# Patient Record
Sex: Male | Born: 1940 | Race: White | Hispanic: No | Marital: Married | Smoking: Never smoker
Health system: Southern US, Community
[De-identification: ages and names within clinical notes are randomized; demographics above are authoritative.]

## PROBLEM LIST (undated history)

## (undated) DIAGNOSIS — I10 Essential (primary) hypertension: Secondary | ICD-10-CM

## (undated) DIAGNOSIS — M1990 Unspecified osteoarthritis, unspecified site: Secondary | ICD-10-CM

## (undated) DIAGNOSIS — M5430 Sciatica, unspecified side: Secondary | ICD-10-CM

## (undated) DIAGNOSIS — Z87442 Personal history of urinary calculi: Secondary | ICD-10-CM

## (undated) DIAGNOSIS — C801 Malignant (primary) neoplasm, unspecified: Secondary | ICD-10-CM

## (undated) HISTORY — PX: PROSTATE SURGERY: SHX751

## (undated) HISTORY — PX: TONSILLECTOMY: SUR1361

## (undated) HISTORY — PX: APPENDECTOMY: SHX54

---

## 2012-06-30 DIAGNOSIS — C801 Malignant (primary) neoplasm, unspecified: Secondary | ICD-10-CM

## 2012-06-30 HISTORY — DX: Malignant (primary) neoplasm, unspecified: C80.1

## 2015-06-27 ENCOUNTER — Other Ambulatory Visit (HOSPITAL_COMMUNITY): Payer: Self-pay | Admitting: Orthopaedic Surgery

## 2015-07-03 ENCOUNTER — Encounter (HOSPITAL_COMMUNITY): Payer: Self-pay

## 2015-07-03 ENCOUNTER — Encounter (HOSPITAL_COMMUNITY)
Admission: RE | Admit: 2015-07-03 | Discharge: 2015-07-03 | Disposition: A | Discharge status: Home / Self Care | Payer: Medicare Other | Source: Ambulatory Visit | Attending: Orthopaedic Surgery | Admitting: Orthopaedic Surgery

## 2015-07-03 DIAGNOSIS — I10 Essential (primary) hypertension: Secondary | ICD-10-CM | POA: Diagnosis not present

## 2015-07-03 DIAGNOSIS — Z01812 Encounter for preprocedural laboratory examination: Secondary | ICD-10-CM | POA: Insufficient documentation

## 2015-07-03 DIAGNOSIS — M1611 Unilateral primary osteoarthritis, right hip: Secondary | ICD-10-CM | POA: Diagnosis not present

## 2015-07-03 DIAGNOSIS — Z79899 Other long term (current) drug therapy: Secondary | ICD-10-CM | POA: Diagnosis not present

## 2015-07-03 HISTORY — DX: Unspecified osteoarthritis, unspecified site: M19.90

## 2015-07-03 HISTORY — DX: Malignant (primary) neoplasm, unspecified: C80.1

## 2015-07-03 HISTORY — DX: Essential (primary) hypertension: I10

## 2015-07-03 HISTORY — DX: Sciatica, unspecified side: M54.30

## 2015-07-03 HISTORY — DX: Personal history of urinary calculi: Z87.442

## 2015-07-03 LAB — BASIC METABOLIC PANEL
ANION GAP: 6 (ref 5–15)
BUN: 15 mg/dL (ref 6–20)
CHLORIDE: 109 mmol/L (ref 101–111)
CO2: 24 mmol/L (ref 22–32)
Calcium: 9.1 mg/dL (ref 8.9–10.3)
Creatinine, Ser: 0.95 mg/dL (ref 0.61–1.24)
GFR calc Af Amer: >60 mL/min (ref >60)
GLUCOSE: 103 mg/dL — AB (ref 65–99)
POTASSIUM: 4.3 mmol/L (ref 3.5–5.1)
Sodium: 139 mmol/L (ref 135–145)

## 2015-07-03 LAB — SURGICAL PCR SCREEN
MRSA, PCR: NEGATIVE
Staphylococcus aureus: NEGATIVE

## 2015-07-03 LAB — CBC
HEMATOCRIT: 44.1 % (ref 39.0–52.0)
HEMOGLOBIN: 14.7 g/dL (ref 13.0–17.0)
MCH: 30.3 pg (ref 26.0–34.0)
MCHC: 33.3 g/dL (ref 30.0–36.0)
MCV: 90.9 fL (ref 78.0–100.0)
Platelets: 275 10*3/uL (ref 150–400)
RBC: 4.85 MIL/uL (ref 4.22–5.81)
RDW: 14.6 % (ref 11.5–15.5)
WBC: 6.7 10*3/uL (ref 4.0–10.5)

## 2015-07-03 NOTE — Pre-Procedure Instructions (Signed)
    Scott Rowland  07/03/2015     Your procedure is scheduled on Tuesday, January 10.  Report to [facility] Admitting at 1:25 P.M.               Your surgery or procedure is scheduled for 3:25 PM   Call this number if you have problems the morning of surgery:[phone]               For any other questions, please call [phone], Monday - Friday 8 AM - 4 PM.   Remember:  Do not eat food or drink liquids after midnight Monday, January 9.  Take these medicines the morning of surgery with A SIP OF WATER: rosuvastatin (CRESTOR) if due.               Today, January 3, STOP taking Stop taking Aspirin, Aspirin Products and herbal medications.  Do not take any NSAIDS ie:  Ibuprofen, Advil, Naproxen.   Do not wear jewelry, make-up or nail polish.  Do not wear lotions, powders, or perfumes.    Men may shave face and neck.  Do not bring valuables to the hospital.  [facility] is not responsible for any belongings or valuables.  Contacts, dentures or bridgework may not be worn into surgery.  Leave your suitcase in the car.  After surgery it may be brought to your room.  For patients admitted to the hospital, discharge time will be determined by your treatment team.  Patients discharged the day of surgery will not be allowed to drive home.   Name and phone number of your driver:  -  Special instructions: Review  [facility] - Preparing For Surgery.  Please read over the following fact sheets that you were given. Pain Booklet, Coughing and Deep Breathing and Surgical Site Infection Prevention and Incentive Spirometery

## 2015-07-03 NOTE — Progress Notes (Signed)
PCP is Dr Jess Barters in [place].  Patient resides 6 months in [place] and 6 months in [place] , [place].  Patient denies chest pain, does not see a cardiologist. Had an EKG within the year at [facility] in [place].  I requested EKG.

## 2015-07-09 MED ORDER — SODIUM CHLORIDE 0.9 % IV SOLN
1000.0000 mg | INTRAVENOUS | Status: DC
Start: 2015-07-09 — End: 2015-07-10
  Filled 2015-07-09: qty 10

## 2015-07-09 MED ORDER — CEFAZOLIN SODIUM-DEXTROSE 2-3 GM-% IV SOLR
2.0000 g | INTRAVENOUS | Status: AC
  Administered 2015-07-10: 2 g via INTRAVENOUS
  Filled 2015-07-09: qty 50

## 2015-07-10 ENCOUNTER — Encounter (HOSPITAL_COMMUNITY)
Admission: RE | Disposition: A | Discharge status: Home Health | Payer: Self-pay | Source: Ambulatory Visit | Attending: Orthopaedic Surgery

## 2015-07-10 ENCOUNTER — Inpatient Hospital Stay (HOSPITAL_COMMUNITY): Payer: Medicare Other | Admitting: Anesthesiology

## 2015-07-10 ENCOUNTER — Encounter (HOSPITAL_COMMUNITY): Payer: Self-pay | Admitting: General Practice

## 2015-07-10 ENCOUNTER — Inpatient Hospital Stay (HOSPITAL_COMMUNITY): Payer: Medicare Other

## 2015-07-10 ENCOUNTER — Inpatient Hospital Stay (HOSPITAL_COMMUNITY)
Admission: RE | Admit: 2015-07-10 | Discharge: 2015-07-12 | DRG: 470 | Disposition: A | Discharge status: Home Health | Payer: Medicare Other | Source: Ambulatory Visit | Attending: Orthopaedic Surgery | Admitting: Orthopaedic Surgery

## 2015-07-10 DIAGNOSIS — Z419 Encounter for procedure for purposes other than remedying health state, unspecified: Secondary | ICD-10-CM

## 2015-07-10 DIAGNOSIS — Z8546 Personal history of malignant neoplasm of prostate: Secondary | ICD-10-CM | POA: Diagnosis not present

## 2015-07-10 DIAGNOSIS — I10 Essential (primary) hypertension: Secondary | ICD-10-CM | POA: Diagnosis present

## 2015-07-10 DIAGNOSIS — M1611 Unilateral primary osteoarthritis, right hip: Principal | ICD-10-CM | POA: Diagnosis present

## 2015-07-10 DIAGNOSIS — Z96641 Presence of right artificial hip joint: Secondary | ICD-10-CM

## 2015-07-10 DIAGNOSIS — M25551 Pain in right hip: Secondary | ICD-10-CM | POA: Diagnosis present

## 2015-07-10 HISTORY — PX: TOTAL HIP ARTHROPLASTY: SHX124

## 2015-07-10 SURGERY — ARTHROPLASTY, HIP, TOTAL, ANTERIOR APPROACH
Anesthesia: General | Laterality: Right

## 2015-07-10 MED ORDER — FENTANYL CITRATE (PF) 100 MCG/2ML IJ SOLN
INTRAMUSCULAR | Status: AC
  Administered 2015-07-10: 50 ug via INTRAVENOUS
  Filled 2015-07-10: qty 2

## 2015-07-10 MED ORDER — SODIUM CHLORIDE 0.9 % IV SOLN
INTRAVENOUS | Status: DC
Start: 2015-07-10 — End: 2015-07-12
  Administered 2015-07-10: 21:00:00 via INTRAVENOUS

## 2015-07-10 MED ORDER — PROMETHAZINE HCL 25 MG/ML IJ SOLN: 6.2500 mg | INTRAMUSCULAR | Status: DC | PRN

## 2015-07-10 MED ORDER — SODIUM CHLORIDE 0.9 % IR SOLN
Status: DC | PRN
  Administered 2015-07-10: 1000 mL

## 2015-07-10 MED ORDER — PHENYLEPHRINE 40 MCG/ML (10ML) SYRINGE FOR IV PUSH (FOR BLOOD PRESSURE SUPPORT)
PREFILLED_SYRINGE | INTRAVENOUS | Status: AC
  Filled 2015-07-10: qty 10

## 2015-07-10 MED ORDER — POLYETHYLENE GLYCOL 3350 17 G PO PACK: 17.0000 g | PACK | Freq: Every day | ORAL | Status: DC | PRN

## 2015-07-10 MED ORDER — ONDANSETRON HCL 4 MG/2ML IJ SOLN
INTRAMUSCULAR | Status: DC | PRN
  Administered 2015-07-10: 4 mg via INTRAVENOUS

## 2015-07-10 MED ORDER — DOCUSATE SODIUM 100 MG PO CAPS
100.0000 mg | ORAL_CAPSULE | Freq: Two times a day (BID) | ORAL | Status: DC
  Administered 2015-07-10 – 2015-07-12 (×4): 100 mg via ORAL
  Filled 2015-07-10 (×4): qty 1

## 2015-07-10 MED ORDER — KETOROLAC TROMETHAMINE 15 MG/ML IJ SOLN
7.5000 mg | Freq: Four times a day (QID) | INTRAMUSCULAR | Status: AC
  Administered 2015-07-10 – 2015-07-11 (×4): 7.5 mg via INTRAVENOUS
  Filled 2015-07-10 (×3): qty 1

## 2015-07-10 MED ORDER — MIDAZOLAM HCL 2 MG/2ML IJ SOLN
INTRAMUSCULAR | Status: AC
  Filled 2015-07-10: qty 2

## 2015-07-10 MED ORDER — 0.9 % SODIUM CHLORIDE (POUR BTL) OPTIME
TOPICAL | Status: DC | PRN
  Administered 2015-07-10: 1000 mL

## 2015-07-10 MED ORDER — FENTANYL CITRATE (PF) 250 MCG/5ML IJ SOLN
INTRAMUSCULAR | Status: AC
  Filled 2015-07-10: qty 5

## 2015-07-10 MED ORDER — METHOCARBAMOL 500 MG PO TABS
500.0000 mg | ORAL_TABLET | Freq: Four times a day (QID) | ORAL | Status: DC | PRN
  Administered 2015-07-10 – 2015-07-11 (×2): 500 mg via ORAL
  Filled 2015-07-10 (×2): qty 1

## 2015-07-10 MED ORDER — OXYCODONE HCL 5 MG PO TABS
ORAL_TABLET | ORAL | Status: AC
  Filled 2015-07-10: qty 2

## 2015-07-10 MED ORDER — OXYCODONE HCL 5 MG PO TABS
5.0000 mg | ORAL_TABLET | ORAL | Status: DC | PRN
  Administered 2015-07-10 – 2015-07-12 (×7): 10 mg via ORAL
  Filled 2015-07-10 (×7): qty 2

## 2015-07-10 MED ORDER — METHOCARBAMOL 500 MG PO TABS
ORAL_TABLET | ORAL | Status: AC
  Filled 2015-07-10: qty 1

## 2015-07-10 MED ORDER — ACETAMINOPHEN 325 MG PO TABS
650.0000 mg | ORAL_TABLET | Freq: Four times a day (QID) | ORAL | Status: DC | PRN
  Administered 2015-07-11: 650 mg via ORAL
  Filled 2015-07-10: qty 2

## 2015-07-10 MED ORDER — METOCLOPRAMIDE HCL 5 MG/ML IJ SOLN: 5.0000 mg | Freq: Three times a day (TID) | INTRAMUSCULAR | Status: DC | PRN

## 2015-07-10 MED ORDER — ONDANSETRON HCL 4 MG PO TABS
4.0000 mg | ORAL_TABLET | Freq: Four times a day (QID) | ORAL | Status: DC | PRN
Start: 2015-07-10 — End: 2015-07-12

## 2015-07-10 MED ORDER — ACETAMINOPHEN 650 MG RE SUPP: 650.0000 mg | Freq: Four times a day (QID) | RECTAL | Status: DC | PRN

## 2015-07-10 MED ORDER — DIPHENHYDRAMINE HCL 12.5 MG/5ML PO ELIX: 12.5000 mg | ORAL_SOLUTION | ORAL | Status: DC | PRN

## 2015-07-10 MED ORDER — LACTATED RINGERS IV SOLN
INTRAVENOUS | Status: DC
Start: 2015-07-10 — End: 2015-07-10
  Administered 2015-07-10 (×2): via INTRAVENOUS

## 2015-07-10 MED ORDER — FENTANYL CITRATE (PF) 250 MCG/5ML IJ SOLN
INTRAMUSCULAR | Status: DC | PRN
  Administered 2015-07-10: 50 ug via INTRAVENOUS
  Administered 2015-07-10: 100 ug via INTRAVENOUS
  Administered 2015-07-10 (×2): 50 ug via INTRAVENOUS

## 2015-07-10 MED ORDER — TRANEXAMIC ACID 1000 MG/10ML IV SOLN
1000.0000 mg | Freq: Once | INTRAVENOUS | Status: AC
  Administered 2015-07-10: 1000 mg via INTRAVENOUS
  Filled 2015-07-10: qty 10

## 2015-07-10 MED ORDER — LIDOCAINE HCL (CARDIAC) 20 MG/ML IV SOLN
INTRAVENOUS | Status: AC
  Filled 2015-07-10: qty 5

## 2015-07-10 MED ORDER — METHOCARBAMOL 1000 MG/10ML IJ SOLN: 500.0000 mg | Freq: Four times a day (QID) | INTRAVENOUS | Status: DC | PRN

## 2015-07-10 MED ORDER — EPHEDRINE SULFATE 50 MG/ML IJ SOLN
INTRAMUSCULAR | Status: DC | PRN
  Administered 2015-07-10: 10 mg via INTRAVENOUS

## 2015-07-10 MED ORDER — ALUM & MAG HYDROXIDE-SIMETH 200-200-20 MG/5ML PO SUSP: 30.0000 mL | ORAL | Status: DC | PRN

## 2015-07-10 MED ORDER — ASPIRIN EC 325 MG PO TBEC
325.0000 mg | DELAYED_RELEASE_TABLET | Freq: Two times a day (BID) | ORAL | Status: DC
  Administered 2015-07-11 – 2015-07-12 (×3): 325 mg via ORAL
  Filled 2015-07-10 (×3): qty 1

## 2015-07-10 MED ORDER — BISACODYL 10 MG RE SUPP: 10.0000 mg | Freq: Every day | RECTAL | Status: DC | PRN

## 2015-07-10 MED ORDER — PHENOL 1.4 % MT LIQD: 1.0000 | OROMUCOSAL | Status: DC | PRN

## 2015-07-10 MED ORDER — GLYCOPYRROLATE 0.2 MG/ML IJ SOLN
INTRAMUSCULAR | Status: DC | PRN
  Administered 2015-07-10: 0.6 mg via INTRAVENOUS

## 2015-07-10 MED ORDER — ONDANSETRON HCL 4 MG/2ML IJ SOLN: 4.0000 mg | Freq: Four times a day (QID) | INTRAMUSCULAR | Status: DC | PRN

## 2015-07-10 MED ORDER — ROCURONIUM BROMIDE 50 MG/5ML IV SOLN
INTRAVENOUS | Status: AC
  Filled 2015-07-10: qty 1

## 2015-07-10 MED ORDER — HYDROMORPHONE HCL 1 MG/ML IJ SOLN
INTRAMUSCULAR | Status: AC
  Filled 2015-07-10: qty 1

## 2015-07-10 MED ORDER — ROCURONIUM BROMIDE 100 MG/10ML IV SOLN
INTRAVENOUS | Status: DC | PRN
  Administered 2015-07-10: 40 mg via INTRAVENOUS

## 2015-07-10 MED ORDER — MEPERIDINE HCL 25 MG/ML IJ SOLN: 6.2500 mg | INTRAMUSCULAR | Status: DC | PRN

## 2015-07-10 MED ORDER — CEFAZOLIN SODIUM 1-5 GM-% IV SOLN
1.0000 g | Freq: Four times a day (QID) | INTRAVENOUS | Status: AC
  Administered 2015-07-10 – 2015-07-11 (×2): 1 g via INTRAVENOUS
  Filled 2015-07-10 (×2): qty 50

## 2015-07-10 MED ORDER — ADULT MULTIVITAMIN W/MINERALS CH
1.0000 | ORAL_TABLET | Freq: Every day | ORAL | Status: DC
  Administered 2015-07-11 – 2015-07-12 (×2): 1 via ORAL
  Filled 2015-07-10 (×2): qty 1

## 2015-07-10 MED ORDER — KETOROLAC TROMETHAMINE 15 MG/ML IJ SOLN
INTRAMUSCULAR | Status: AC
  Administered 2015-07-10: 7.5 mg via INTRAVENOUS
  Filled 2015-07-10: qty 1

## 2015-07-10 MED ORDER — MIDAZOLAM HCL 5 MG/5ML IJ SOLN
INTRAMUSCULAR | Status: DC | PRN
  Administered 2015-07-10: 2 mg via INTRAVENOUS

## 2015-07-10 MED ORDER — METOCLOPRAMIDE HCL 5 MG PO TABS: 5.0000 mg | ORAL_TABLET | Freq: Three times a day (TID) | ORAL | Status: DC | PRN

## 2015-07-10 MED ORDER — LIDOCAINE HCL (CARDIAC) 20 MG/ML IV SOLN
INTRAVENOUS | Status: DC | PRN
  Administered 2015-07-10: 60 mg via INTRAVENOUS

## 2015-07-10 MED ORDER — HYDROMORPHONE HCL 1 MG/ML IJ SOLN
1.0000 mg | INTRAMUSCULAR | Status: DC | PRN
  Administered 2015-07-10: 1 mg via INTRAVENOUS

## 2015-07-10 MED ORDER — PHENYLEPHRINE HCL 10 MG/ML IJ SOLN
INTRAMUSCULAR | Status: DC | PRN
  Administered 2015-07-10: 40 ug via INTRAVENOUS
  Administered 2015-07-10: 120 ug via INTRAVENOUS
  Administered 2015-07-10 (×2): 80 ug via INTRAVENOUS
  Administered 2015-07-10: 40 ug via INTRAVENOUS
  Administered 2015-07-10: 80 ug via INTRAVENOUS

## 2015-07-10 MED ORDER — NEOSTIGMINE METHYLSULFATE 10 MG/10ML IV SOLN
INTRAVENOUS | Status: DC | PRN
  Administered 2015-07-10: 4 mg via INTRAVENOUS

## 2015-07-10 MED ORDER — PROPOFOL 10 MG/ML IV BOLUS
INTRAVENOUS | Status: AC
  Filled 2015-07-10: qty 20

## 2015-07-10 MED ORDER — FENTANYL CITRATE (PF) 100 MCG/2ML IJ SOLN
INTRAMUSCULAR | Status: AC
  Filled 2015-07-10: qty 2

## 2015-07-10 MED ORDER — PROPOFOL 10 MG/ML IV BOLUS
INTRAVENOUS | Status: DC | PRN
  Administered 2015-07-10: 180 mg via INTRAVENOUS

## 2015-07-10 MED ORDER — MENTHOL 3 MG MT LOZG: 1.0000 | LOZENGE | OROMUCOSAL | Status: DC | PRN

## 2015-07-10 MED ORDER — FENTANYL CITRATE (PF) 100 MCG/2ML IJ SOLN
25.0000 ug | INTRAMUSCULAR | Status: DC | PRN
  Administered 2015-07-10 (×3): 50 ug via INTRAVENOUS

## 2015-07-10 SURGICAL SUPPLY — 52 items
BENZOIN TINCTURE PRP APPL 2/3 (GAUZE/BANDAGES/DRESSINGS) ×3 IMPLANT
BLADE SAW SGTL 18X1.27X75 (BLADE) ×2 IMPLANT
BLADE SAW SGTL 18X1.27X75MM (BLADE) ×1
BLADE SURG ROTATE 9660 (MISCELLANEOUS) IMPLANT
CAPT HIP TOTAL 2 ×3 IMPLANT
CELLS DAT CNTRL 66122 CELL SVR (MISCELLANEOUS) ×1 IMPLANT
CLOSURE WOUND 1/2 X4 (GAUZE/BANDAGES/DRESSINGS) ×1
COVER SURGICAL LIGHT HANDLE (MISCELLANEOUS) ×3 IMPLANT
DRAPE C-ARM 42X72 X-RAY (DRAPES) ×3 IMPLANT
DRAPE STERI IOBAN 125X83 (DRAPES) ×3 IMPLANT
DRAPE U-SHAPE 47X51 STRL (DRAPES) ×9 IMPLANT
DRSG AQUACEL AG ADV 3.5X10 (GAUZE/BANDAGES/DRESSINGS) ×3 IMPLANT
DURAPREP 26ML APPLICATOR (WOUND CARE) ×3 IMPLANT
ELECT BLADE 4.0 EZ CLEAN MEGAD (MISCELLANEOUS) ×3
ELECT BLADE 6.5 EXT (BLADE) IMPLANT
ELECT REM PT RETURN 9FT ADLT (ELECTROSURGICAL) ×3
ELECTRODE BLDE 4.0 EZ CLN MEGD (MISCELLANEOUS) ×1 IMPLANT
ELECTRODE REM PT RTRN 9FT ADLT (ELECTROSURGICAL) ×1 IMPLANT
FACESHIELD WRAPAROUND (MASK) ×9 IMPLANT
GAUZE XEROFORM 1X8 LF (GAUZE/BANDAGES/DRESSINGS) ×3 IMPLANT
GLOVE BIOGEL PI IND STRL 8 (GLOVE) ×2 IMPLANT
GLOVE BIOGEL PI INDICATOR 8 (GLOVE) ×4
GLOVE ECLIPSE 8.0 STRL XLNG CF (GLOVE) ×6 IMPLANT
GLOVE ORTHO TXT STRL SZ7.5 (GLOVE) ×6 IMPLANT
GOWN STRL REUS W/ TWL LRG LVL3 (GOWN DISPOSABLE) ×2 IMPLANT
GOWN STRL REUS W/ TWL XL LVL3 (GOWN DISPOSABLE) ×2 IMPLANT
GOWN STRL REUS W/TWL LRG LVL3 (GOWN DISPOSABLE) ×4
GOWN STRL REUS W/TWL XL LVL3 (GOWN DISPOSABLE) ×4
HANDPIECE INTERPULSE COAX TIP (DISPOSABLE) ×2
KIT BASIN OR (CUSTOM PROCEDURE TRAY) ×3 IMPLANT
KIT ROOM TURNOVER OR (KITS) ×3 IMPLANT
MANIFOLD NEPTUNE II (INSTRUMENTS) ×3 IMPLANT
NS IRRIG 1000ML POUR BTL (IV SOLUTION) ×3 IMPLANT
PACK TOTAL JOINT (CUSTOM PROCEDURE TRAY) ×3 IMPLANT
PACK UNIVERSAL I (CUSTOM PROCEDURE TRAY) ×3 IMPLANT
PAD ARMBOARD 7.5X6 YLW CONV (MISCELLANEOUS) ×3 IMPLANT
RTRCTR WOUND ALEXIS 18CM MED (MISCELLANEOUS) ×3
SET HNDPC FAN SPRY TIP SCT (DISPOSABLE) ×1 IMPLANT
STAPLER VISISTAT 35W (STAPLE) IMPLANT
STRIP CLOSURE SKIN 1/2X4 (GAUZE/BANDAGES/DRESSINGS) ×2 IMPLANT
SUT ETHIBOND NAB CT1 #1 30IN (SUTURE) ×6 IMPLANT
SUT MNCRL AB 4-0 PS2 18 (SUTURE) ×3 IMPLANT
SUT VIC AB 0 CT1 27 (SUTURE) ×2
SUT VIC AB 0 CT1 27XBRD ANBCTR (SUTURE) ×1 IMPLANT
SUT VIC AB 1 CT1 27 (SUTURE) ×2
SUT VIC AB 1 CT1 27XBRD ANBCTR (SUTURE) ×1 IMPLANT
SUT VIC AB 2-0 CT1 27 (SUTURE) ×2
SUT VIC AB 2-0 CT1 TAPERPNT 27 (SUTURE) ×1 IMPLANT
TOWEL OR 17X24 6PK STRL BLUE (TOWEL DISPOSABLE) ×3 IMPLANT
TOWEL OR 17X26 10 PK STRL BLUE (TOWEL DISPOSABLE) ×3 IMPLANT
TRAY FOLEY CATH 16FRSI W/METER (SET/KITS/TRAYS/PACK) IMPLANT
WATER STERILE IRR 1000ML POUR (IV SOLUTION) ×6 IMPLANT

## 2015-07-10 NOTE — H&P (Signed)
TOTAL HIP ADMISSION H&P  Patient is admitted for right total hip arthroplasty.  Subjective:  Chief Complaint: right hip pain  HPI: Scott Rowland, 75 y.o. male, has a history of pain and functional disability in the right hip(s) due to arthritis and patient has failed non-surgical conservative treatments for greater than 12 weeks to include NSAID's and/or analgesics, corticosteriod injections, weight reduction as appropriate and activity modification.  Onset of symptoms was gradual starting 3 years ago with gradually worsening course since that time.The patient noted no past surgery on the right hip(s).  Patient currently rates pain in the right hip at 10 out of 10 with activity. Patient has night pain, worsening of pain with activity and weight bearing, pain that interfers with activities of daily living, pain with passive range of motion and crepitus. Patient has evidence of subchondral cysts, subchondral sclerosis, periarticular osteophytes and joint space narrowing by imaging studies. This condition presents safety issues increasing the risk of falls.  There is no current active infection.  Patient Active Problem List   Diagnosis Date Noted  . Osteoarthritis of right hip 07/10/2015   Past Medical History  Diagnosis Date  . 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 History of kidney stones   . Hypertension   . Arthritis     osteoarthritis  . Sciatica     left leg    Past Surgical History  Procedure Laterality Date  . Prostate surgery    . Tonsillectomy    . Appendectomy      No prescriptions prior to admission   No Known Allergies  Social History  Substance Use Topics  . Smoking status: Never Smoker   . Smokeless tobacco: Not on file  . Alcohol Use: 1.8 oz/week    2 Glasses of wine, 1 Cans of beer per week    No family history on file.   Review of Systems  Musculoskeletal: Positive for joint pain.  All other systems reviewed and are negative.   Objective:  Physical Exam   Constitutional: He is oriented to person, place, and time. He appears well-developed and well-nourished.  HENT:  Head: Normocephalic and atraumatic.  Eyes: EOM are normal. Pupils are equal, round, and reactive to light.  Neck: Normal range of motion. Neck supple.  Cardiovascular: Normal rate and regular rhythm.   Respiratory: Effort normal and breath sounds normal.  GI: Soft. Bowel sounds are normal.  Musculoskeletal:       Right hip: He exhibits decreased range of motion, decreased strength, tenderness and bony tenderness.  Neurological: He is alert and oriented to person, place, and time.  Skin: Skin is warm and dry.  Psychiatric: He has a normal mood and affect.    Vital signs in last 24 hours:    Labs:   There is no height or weight on file to calculate BMI.   Imaging Review Plain radiographs demonstrate severe degenerative joint disease of the right hip(s). The bone quality appears to be good for age and reported activity level.  Assessment/Plan:  End stage arthritis, right hip(s)  The patient history, physical examination, clinical judgement of the provider and imaging studies are consistent with end stage degenerative joint disease of the right hip(s) and total hip arthroplasty is deemed medically necessary. The treatment options including medical management, injection therapy, arthroscopy and arthroplasty were discussed at length. The risks and benefits of total hip arthroplasty were presented and reviewed. The risks due to aseptic loosening, infection, stiffness, dislocation/subluxation,  thromboembolic complications and  other imponderables were discussed.  The patient acknowledged the explanation, agreed to proceed with the plan and consent was signed. Patient is being admitted for inpatient treatment for surgery, pain control, PT, OT, prophylactic antibiotics, VTE prophylaxis, progressive ambulation and ADL's and discharge planning.The patient is planning to be discharged  home with home health services

## 2015-07-10 NOTE — Anesthesia Procedure Notes (Signed)
Procedure Name: Intubation Date/Time: 07/10/2015 2:29 PM Performed by: Julian Reil Pre-anesthesia Checklist: Patient identified, Emergency Drugs available, Suction available and Patient being monitored Patient Re-evaluated:Patient Re-evaluated prior to inductionOxygen Delivery Method: Circle system utilized Preoxygenation: Pre-oxygenation with 100% oxygen Intubation Type: IV induction Ventilation: Mask ventilation without difficulty Laryngoscope Size: Mac and 4 Grade View: Grade II Tube type: Oral Tube size: 7.5 mm Number of attempts: 1 Airway Equipment and Method: Stylet Placement Confirmation: ETT inserted through vocal cords under direct vision,  positive ETCO2 and breath sounds checked- equal and bilateral Secured at: 22 cm Tube secured with: Tape Dental Injury: Teeth and Oropharynx as per pre-operative assessment

## 2015-07-10 NOTE — Op Note (Signed)
NAME:  Scott Rowland, FLAM NO.:  [id number]  MEDICAL RECORD NO.:  [id number]  LOCATION:  [facility]                         FACILITY:  [facility]  PHYSICIAN:  Willoughby Cordy. Ninfa Linden, M.D.DATE OF BIRTH:  Sep 29, 1940  DATE OF PROCEDURE:  07/10/2015 DATE OF DISCHARGE:                              OPERATIVE REPORT   PREOPERATIVE DIAGNOSIS:  Primary osteoarthritis and degenerative joint disease, right hip.  POSTOPERATIVE DIAGNOSIS:  Primary osteoarthritis and degenerative joint disease, right hip.  PROCEDURE:  Right total hip arthroplasty due to direct anterior approach.  IMPLANTS:  DePuy Sector Gription acetabular component size 56, size 36+ 0 polyethylene liner, size 14 Corail femoral component with standard offset, size 36+ 5 ceramic hip ball.  SURGEON:  Eita Finkler. Ninfa Linden, MD  ASSISTANT:  Erskine Emery, PA-C.  ANESTHESIA:  General.  ANTIBIOTICS:  2 g IV Ancef.  BLOOD LOSS:  200 mL.  COMPLICATIONS:  None.  INDICATIONS:  Mr. Hathorne is a 75 year old gentleman well known to me. He has known debilitating arthritis involving his right hip.  He has tried and failed all forms of conservative treatment including multiple injections that helped him for a while, but now it has gotten to where his pain is daily, has detrimentally affected his activities of  daily living and his quality of life, and his mobility.  At this point, he wishes to proceed with a total hip arthroplasty.  He understands the risks and benefits of the surgery including the risk of acute blood loss, anemia, nerve and vessel injury, fracture, infection, dislocation, DVT.  He understands our goals were decreased pain, improved mobility, and overall improved quality of life.  His x-ray showed complete loss of the joint space and significant sclerotic changes in the hip ball and socket.  PROCEDURE DESCRIPTION:  After informed consent was obtained, appropriate right hip was marked.  He was brought to  the operating room and general anesthesia was obtained while he was on a stretcher.  Traction boots were placed on both his feet.  Next, he was placed supine on the Hana fracture table with the perineal post in place and both legs in inline skeletal traction devices, but no traction applied.  His right operative hip was then prepped and draped with DuraPrep and sterile drapes.  A time-out was called and he was identified as correct patient, correct right hip.  We then made an incision inferior and posterior to the anterior superior iliac spine and carried this obliquely down the leg. We dissected down the tensor fascia lata muscle and tensor fascia was then divided longitudinally, so we could proceed with a direct anterior approach to the hip.  We identified and cauterized the lateral femoral circumflex vessels.  I then identified the femoral neck.  We placed Cobra retractors on the medial and lateral femoral neck.  I then opened up the hip capsule in an L-type format, finally the hip devoid of cartilage.  We placed Cobra retractors within the hip capsule themselves.  We then made our femoral neck cut proximal to lesser trochanter using oscillating saw, we completed this with an osteotome. We placed a corkscrew guide in the femoral head with femoral head in its entirety and  found it to be devoid of cartilage.  We then cleaned the acetabular rim and the acetabular labrum.  We placed a bent Hohmann over the medial acetabular rim and released transverse acetabular ligament. We then began reaming under direct visualization from a size 42 reamer up to a size 56.  With all reamers under direct visualization, the last 2 reamers under direct fluoroscopy, so we could obtain our depth of reaming, our inclination, and anteversion.  Once we were pleased with this, we placed the real DePuy Sector Gription acetabular component size 56, 36+ 0 polyethylene liner for that size of the acetabular  component. Attention was then turned to the femur.  With the leg externally rotated to 100 degrees extended and abducted, we were able to use a Mueller retractor medially and Hohmann retractor behind the greater trochanter. We released lateral joint capsule.  We used a box cutting osteotome to enter femoral canal and a rongeur to lateralize.  I then began broaching from a size 8, broached all the way up to a size 14.  With a size 14 broach, we trialed a standard neck and a 36+ 1.5 hip ball.  We trialed leg back over and up with traction and internal rotation, reducing the pelvis.  We were pleased with stability, but we felt like we needed a little more offset and leg length.  We dislocated the hip and removed the trial components.  We placed the real Corail femoral component size 14 followed by 36+ 5 ceramic hip ball for the real ball.  We reduced the acetabulum and this time, we were definitely pleased with leg lengths offset and stability.  We then irrigated the soft tissue with normal saline solution using pulsatile lavage.  We closed the joint capsule with interrupted #1 Ethibond suture followed by running #1 Vicryl in the tensor fascia, 0 Vicryl in the deep tissue, 2-0 Vicryl subcutaneous tissue, 4-0 Monocryl subcuticular stitch, and Steri-Strips on the skin. An Aquacel dressing was applied.  He was then taken off the Hana table, awakened, extubated, and taken to the recovery room in stable condition. All final counts were correct.  There were no complications noted.  Of note, Erskine Emery, PA-C, assisted in the entire case.  His assistance was crucial for facilitating all aspects of this case.     Lind Guest. Ninfa Linden, M.D.     CYB/MEDQ  D:  07/10/2015  T:  07/10/2015  Job:  [id number]

## 2015-07-10 NOTE — Anesthesia Preprocedure Evaluation (Addendum)
Anesthesia Evaluation  Patient identified by MRN, date of birth, ID band Patient awake    Reviewed: Allergy & Precautions, NPO status , Patient's Chart, lab work & pertinent test results  Airway Mallampati: II   Neck ROM: Full    Dental  (+) Teeth Intact, Dental Advisory Given   Pulmonary neg pulmonary ROS,    breath sounds clear to auscultation       Cardiovascular hypertension, Pt. on medications negative cardio ROS   Rhythm:Regular     Neuro/Psych negative neurological ROS  negative psych ROS   GI/Hepatic negative GI ROS, Neg liver ROS,   Endo/Other  negative endocrine ROS  Renal/GU negative Renal ROSHX stones  negative genitourinary   Musculoskeletal negative musculoskeletal ROS (+) Arthritis ,   Abdominal (+)  Abdomen: soft.    Peds negative pediatric ROS (+)  Hematology negative hematology ROS (+) 14/49   Anesthesia Other Findings   Reproductive/Obstetrics negative OB ROS                           Anesthesia Physical Anesthesia Plan  ASA: II  Anesthesia Plan: General and Spinal   Post-op Pain Management:    Induction: Intravenous  Airway Management Planned: Oral ETT  Additional Equipment:   Intra-op Plan:   Post-operative Plan:   Informed Consent: I have reviewed the patients History and Physical, chart, labs and discussed the procedure including the risks, benefits and alternatives for the proposed anesthesia with the patient or authorized representative who has indicated his/her understanding and acceptance.     Plan Discussed with:   Anesthesia Plan Comments: (Will offer spinal, GA if declines, patient leaning toward GA)       Anesthesia Quick Evaluation

## 2015-07-10 NOTE — Brief Op Note (Signed)
07/10/2015  3:37 PM  PATIENT:  Scott Rowland  75 y.o. male  PRE-OPERATIVE DIAGNOSIS:  Severe osteoarthritis right hip  POST-OPERATIVE DIAGNOSIS:  Severe osteoarthritis right hip  PROCEDURE:  Procedure(s): RIGHT TOTAL HIP ARTHROPLASTY ANTERIOR APPROACH (Right)  SURGEON:  Surgeon(s) and Role:    * Mcarthur Rossetti, MD - Primary  PHYSICIAN ASSISTANT: Benita Stabile, PA-C  ANESTHESIA:   general  EBL:  Total I/O In: 1000 [I.V.:1000] Out: 100 [Blood:100]  BLOOD ADMINISTERED:none  DRAINS: none   LOCAL MEDICATIONS USED:  NONE  SPECIMEN:  No Specimen  DISPOSITION OF SPECIMEN:  N/A  COUNTS:  YES  TOURNIQUET:  * No tourniquets in log *  DICTATION: .Other Dictation: Dictation Number [id number]  PLAN OF CARE: Admit to inpatient   PATIENT DISPOSITION:  PACU - hemodynamically stable.   Delay start of Pharmacological VTE agent (>24hrs) due to surgical blood loss or risk of bleeding: no

## 2015-07-10 NOTE — Transfer of Care (Signed)
Immediate Anesthesia Transfer of Care Note  Patient: Scott Rowland  Procedure(s) Performed: Procedure(s): RIGHT TOTAL HIP ARTHROPLASTY ANTERIOR APPROACH (Right)  Patient Location: PACU  Anesthesia Type:General  Level of Consciousness: awake, alert  and oriented  Airway & Oxygen Therapy: Patient Spontanous Breathing and Patient connected to nasal cannula oxygen  Post-op Assessment: Report given to RN, Post -op Vital signs reviewed and stable and Patient moving all extremities  Post vital signs: Reviewed and stable  Last Vitals:  Filed Vitals:   07/10/15 1244  BP: 153/73  Pulse: 48  Temp: 36.4 C  Resp: 18    Complications: No apparent anesthesia complications

## 2015-07-11 ENCOUNTER — Encounter (HOSPITAL_COMMUNITY): Payer: Self-pay | Admitting: Orthopaedic Surgery

## 2015-07-11 LAB — CBC
HCT: 38.8 % — ABNORMAL LOW (ref 39.0–52.0)
Hemoglobin: 12.2 g/dL — ABNORMAL LOW (ref 13.0–17.0)
MCH: 29 pg (ref 26.0–34.0)
MCHC: 31.4 g/dL (ref 30.0–36.0)
MCV: 92.2 fL (ref 78.0–100.0)
PLATELETS: 253 10*3/uL (ref 150–400)
RBC: 4.21 MIL/uL — AB (ref 4.22–5.81)
RDW: 14.8 % (ref 11.5–15.5)
WBC: 8 10*3/uL (ref 4.0–10.5)

## 2015-07-11 LAB — BASIC METABOLIC PANEL
ANION GAP: 6 (ref 5–15)
BUN: 10 mg/dL (ref 6–20)
CO2: 28 mmol/L (ref 22–32)
Calcium: 8.6 mg/dL — ABNORMAL LOW (ref 8.9–10.3)
Chloride: 105 mmol/L (ref 101–111)
Creatinine, Ser: 1.08 mg/dL (ref 0.61–1.24)
GFR calc Af Amer: >60 mL/min (ref >60)
Glucose, Bld: 112 mg/dL — ABNORMAL HIGH (ref 65–99)
POTASSIUM: 4.2 mmol/L (ref 3.5–5.1)
SODIUM: 139 mmol/L (ref 135–145)

## 2015-07-11 NOTE — Evaluation (Signed)
Occupational Therapy Evaluation Patient Details Name: Scott Rowland MRN: [id number] DOB: 1941-02-11 Today's Date: 07/11/2015    History of Present Illness Admitted for R THA; Direct anterior; WBAT;  has a past medical history of Cancer ([place]) (2014); History of kidney stones; Hypertension; Arthritis; and Sciatica.   Clinical Impression   Pt reports he was independent with ADLs and mobility PTA. Currently pt is overall supervision for safety with ADLs and functional mobility with the exception of min assist for LB dressing. Began safety and ADL education with pt. Pt planning to d/c to brother in [place] in [place] for a few weeks with 24/7 supervision from his wife. Pt would benefit from continued OT in order to maximize independence and safety with tub transfers using a 3 in 1.    Follow Up Recommendations  No OT follow up;Supervision - Intermittent    Equipment Recommendations  3 in 1 bedside comode    Recommendations for Other Services       Precautions / Restrictions Precautions Precautions: None Restrictions Weight Bearing Restrictions: Yes RLE Weight Bearing: Weight bearing as tolerated      Mobility Bed Mobility Overal bed mobility: Needs Assistance Bed Mobility: Supine to Sit;Sit to Supine     Supine to sit: Supervision Sit to supine: Supervision   General bed mobility comments: Supervision for safety; no physical assist. Good technique.  Transfers Overall transfer level: Needs assistance Equipment used: Rolling walker (2 wheeled) Transfers: Sit to/from Stand Sit to Stand: Supervision         General transfer comment: Supervision for safety, no physical assist. Good hand placement and technique.    Balance Overall balance assessment: Needs assistance Sitting-balance support: Feet supported;No upper extremity supported Sitting balance-Leahy Scale: Good     Standing balance support: No upper extremity supported;During functional activity Standing  balance-Leahy Scale: Fair                              ADL Overall ADL's : Needs assistance/impaired Eating/Feeding: Set up;Sitting   Grooming: Supervision/safety;Standing       Lower Body Bathing: Supervison/ safety;Sit to/from stand       Lower Body Dressing: Minimal assistance;Sit to/from stand Lower Body Dressing Details (indicate cue type and reason): Pt unable to don R sock, able to don L. Pt reports wife can assist as needed. Educated on compensatory strategies for LB ADLs. Toilet Transfer: Supervision/safety;Ambulation;BSC;RW (BSC over toilet)   Toileting- Clothing Manipulation and Hygiene: Supervision/safety;Sit to/from Nurse, children's Details (indicate cue type and reason): Educated on use of 3 in 1 in tub; plan to practice next session. Functional mobility during ADLs: Supervision/safety;Rolling walker General ADL Comments: No family present for OT eval. Educated on ice for edema and pain, home safety; pt verbalized understanding.     Vision     Perception     Praxis      Pertinent Vitals/Pain Pain Assessment: 0-10 Pain Score: 4  Pain Location: R hip with functional mobility Pain Descriptors / Indicators: Sore ("twinge") Pain Intervention(s): Limited activity within patient's tolerance;Monitored during session;Ice applied;Patient requesting pain meds-RN notified     Hand Dominance     Extremity/Trunk Assessment Upper Extremity Assessment Upper Extremity Assessment: Overall WFL for tasks assessed   Lower Extremity Assessment Lower Extremity Assessment: Defer to PT evaluation   Cervical / Trunk Assessment Cervical / Trunk Assessment: Normal   Communication Communication Communication: No difficulties   Cognition Arousal/Alertness: Awake/alert Behavior  During Therapy: WFL for tasks assessed/performed Overall Cognitive Status: Within Functional Limits for tasks assessed                     General Comments        Exercises       Shoulder Instructions      Home Living Family/patient expects to be discharged to:: Private residence Living Arrangements: Spouse/significant other Available Help at Discharge: Family;Available 24 hours/day Type of Home: House Home Access: Stairs to enter [employer] of Steps: 2 Entrance Stairs-Rails: None Home Layout: Two level;Bed/bath upstairs Alternate Level Stairs-Number of Steps: 12 Alternate Level Stairs-Rails: Right;Left Bathroom Shower/Tub: Teacher, early years/pre: Handicapped height Bathroom Accessibility: Yes How Accessible: Accessible via walker Home Equipment: None   Additional Comments: Pt staying at brother in laws house in [place] for a few weeks      Prior Functioning/Environment Level of Independence: Independent             OT Diagnosis: Acute pain   OT Problem List: Impaired balance (sitting and/or standing);Decreased knowledge of use of DME or AE;Pain   OT Treatment/Interventions: Self-care/ADL training;DME and/or AE instruction;Patient/family education    OT Goals(Current goals can be found in the care plan section) Acute Rehab OT Goals Patient Stated Goal: Be able to get back to physical activity OT Goal Formulation: With patient Time For Goal Achievement: 07/25/15 Potential to Achieve Goals: Good ADL Goals Pt Will Perform Tub/Shower Transfer: Tub transfer;with supervision;ambulating;3 in 1;rolling walker  OT Frequency: Min 2X/week   Barriers to D/C: Inaccessible home environment  Bed and bath on second floor       Co-evaluation              End of Session Equipment Utilized During Treatment: Gait belt;Rolling walker Nurse Communication: Mobility status;Patient requests pain meds  Activity Tolerance: Patient tolerated treatment well Patient left: in bed;with call bell/phone within reach   Time: 1438-1450 OT Time Calculation (min): 12 min Charges:  OT General Charges $OT Visit: 1  Procedure OT Evaluation $OT Eval Low Complexity: 1 Procedure G-Codes:     Binnie Kand M.S., OTR/L Pager: [phone]  07/11/2015, 3:13 PM

## 2015-07-11 NOTE — Anesthesia Postprocedure Evaluation (Addendum)
Anesthesia Post Note  Patient: Scott Rowland  Procedure(s) Performed: Procedure(s) (LRB): RIGHT TOTAL HIP ARTHROPLASTY ANTERIOR APPROACH (Right)  Patient location during evaluation: PACU Anesthesia Type: General Level of consciousness: awake and alert and patient cooperative Pain management: pain level controlled Vital Signs Assessment: post-procedure vital signs reviewed and stable Respiratory status: spontaneous breathing and respiratory function stable Cardiovascular status: stable Anesthetic complications: no    Last Vitals:  Filed Vitals:   07/10/15 2111 07/11/15 0524  BP: 110/54 91/52  Pulse: 98 69  Temp: 36.6 C 36.8 C  Resp: 18 18    Last Pain:  Filed Vitals:   07/11/15 0653  PainSc: [place]

## 2015-07-11 NOTE — Care Management Note (Signed)
Case Management Note  Patient Details  Name: Scott Rowland MRN: [id number] Date of Birth: 20-Apr-1941  Subjective/Objective: 75 yr old gentleman s/p right total hip arthroplasty.    Action/Plan: Case manager spoke with patient and his wife concerning home health and DME needs at discharge. Patient was preoperatively setup with [facility]. They state that his brother-in-law is a physical therapist and will be assisting him at discharge.He has a rolling walker and 3in1. He doesn't feel he will need any further assistance beyond him. Patient came here from [place] for surgery and will stay with family here. Will be recovering at [address] [place], [place] [address]. The plan is for Mr. Jackovich and wife to go to their home in [place] to further recover. Case manager will continue to monitor.    Expected Discharge Date:   07/12/15               Expected Discharge Plan:  [place]  In-House Referral:  NA  Discharge planning Services  CM Consult  Post Acute Care Choice:  Home Health Choice offered to:  Patient, Spouse  DME Arranged:  N/A DME Agency:  NA  HH Arranged:  PT HH Agency:  [facility]  Status of Service:  Completed, signed off  Medicare Important Message Given:    Date Medicare IM Given:    Medicare IM give by:    Date Additional Medicare IM Given:    Additional Medicare Important Message give by:     If discussed at [place] of Stay Meetings, dates discussed:    Additional Comments:  Ninfa Meeker, RN 07/11/2015, 11:16 AM

## 2015-07-11 NOTE — Progress Notes (Signed)
Physical Therapy Treatment Patient Details Name: Scott Rowland MRN: [id number] DOB: Feb 17, 1941 Today's Date: 07/11/2015    History of Present Illness Admitted for R THA; Direct anterior; WBAT;  has a past medical history of Cancer ([place]) (2014); History of kidney stones; Hypertension; Arthritis; and Sciatica.    PT Comments    Continuing excellent mobility; Stair training complete; I anticipate he will be able to dc tomorrow  Follow Up Recommendations  Home health PT;Supervision - Intermittent     Equipment Recommendations  Rolling walker with 5" wheels;3in1 (PT)    Recommendations for Other Services OT consult     Precautions / Restrictions Precautions Precautions: None Restrictions Weight Bearing Restrictions: Yes RLE Weight Bearing: Weight bearing as tolerated    Mobility  Bed Mobility Overal bed mobility: Needs Assistance Bed Mobility: Supine to Sit     Supine to sit: Supervision Sit to supine: Supervision   General bed mobility comments: Supervision for safety; no physical assist. Good technique.  Transfers Overall transfer level: Needs assistance Equipment used: Rolling walker (2 wheeled) Transfers: Sit to/from Stand Sit to Stand: Supervision         General transfer comment: Supervision for safety, no physical assist. Good hand placement and technique.  Ambulation/Gait Ambulation/Gait assistance: Supervision Ambulation Distance (Feet): 520 Feet Assistive device: Rolling walker (2 wheeled) Gait Pattern/deviations: Step-through pattern     General Gait Details: Cues for fully upright posture and to activate glute in stance for hip stability   Stairs Stairs: Yes Stairs assistance: Min assist Stair Management: No rails;Backwards;With walker;One rail Right;Sideways;Step to pattern Number of Stairs: 2 (x3) General stair comments: Mr. and Mrs. Loomer proacticed going up and down steps backwards with RW and sideways with one rail Right; Cues for  technique; managed steps quite well; I do not anticipate steps being a barrier for him  Wheelchair Mobility    Modified Rankin (Stroke Patients Only)       Balance Overall balance assessment: Needs assistance Sitting-balance support: Feet supported;No upper extremity supported Sitting balance-Leahy Scale: Good     Standing balance support: No upper extremity supported;During functional activity Standing balance-Leahy Scale: Fair                      Cognition Arousal/Alertness: Awake/alert Behavior During Therapy: WFL for tasks assessed/performed Overall Cognitive Status: Within Functional Limits for tasks assessed                      Exercises Total Joint Exercises Ankle Circles/Pumps: AROM;Both;10 reps Quad Sets: AROM;Right;10 reps Gluteal Sets: AROM;Both;10 reps Towel Squeeze: AROM;Both;10 reps Heel Slides: AROM;Right;10 reps Hip ABduction/ADduction: AROM;Right;10 reps Bridges: AROM;Both;10 reps    General Comments        Pertinent Vitals/Pain Pain Assessment: 0-10 Pain Score: 3  Pain Location: R hip Pain Descriptors / Indicators: Aching;Sore Pain Intervention(s): Monitored during session;Premedicated before session    [place] expects to be discharged to:: Private residence Living Arrangements: Spouse/significant other Available Help at Discharge: Family;Available 24 hours/day Type of Home: House Home Access: Stairs to enter Entrance Stairs-Rails: None Home Layout: Two level;Bed/bath upstairs Home Equipment: None Additional Comments: Pt staying at brother in laws house in [place] for a few weeks    Prior Function Level of Independence: Independent          PT Goals (current goals can now be found in the care plan section) Acute Rehab PT Goals Patient Stated Goal: Be able to get back to physical activity PT  Goal Formulation: With patient Time For Goal Achievement: 07/18/15 Potential to Achieve Goals:  Good Progress towards PT goals: Progressing toward goals    Frequency  7X/week    PT Plan Current plan remains appropriate    Co-evaluation             End of Session Equipment Utilized During Treatment: Gait belt Activity Tolerance: Patient tolerated treatment well Patient left: in chair;with call bell/phone within reach;with family/visitor present     Time: [id number] PT Time Calculation (min) (ACUTE ONLY): 34 min  Charges:  $Gait Training: 8-22 mins $Therapeutic Exercise: 8-22 mins                    G Codes:      Quin Hoop 07/11/2015, 4:26 PM  Roney Marion, [place] Pager [phone] Office [phone]

## 2015-07-11 NOTE — Progress Notes (Signed)
Utilization review completed.  

## 2015-07-11 NOTE — Progress Notes (Signed)
Subjective: 1 Day Post-Op Procedure(s) (LRB): RIGHT TOTAL HIP ARTHROPLASTY ANTERIOR APPROACH (Right) Patient reports pain as moderate.  Labs not back yet.  Objective: Vital signs in last 24 hours: Temp:  [97.5 F (36.4 C)-98.2 F (36.8 C)] 98.2 F (36.8 C) (01/11 0524) Pulse Rate:  [48-98] 69 (01/11 0524) Resp:  [11-23] 18 (01/11 0524) BP: (91-153)/(52-82) 91/52 mmHg (01/11 0524) SpO2:  [93 %-100 %] 93 % (01/11 0524) Weight:  [81.194 kg (179 lb)] 81.194 kg (179 lb) (01/10 1244)  Intake/Output from previous day: 01/10 0701 - 01/11 0700 In: 2521.3 [P.O.:840; I.V.:1681.3] Out: 900 [Urine:800; Blood:100] Intake/Output this shift:    No results for input(s): HGB in the last 72 hours. No results for input(s): WBC, RBC, HCT, PLT in the last 72 hours. No results for input(s): NA, K, CL, CO2, BUN, CREATININE, GLUCOSE, CALCIUM in the last 72 hours. No results for input(s): LABPT, INR in the last 72 hours.  Sensation intact distally Intact pulses distally Dorsiflexion/Plantar flexion intact Incision: scant drainage  Assessment/Plan: 1 Day Post-Op Procedure(s) (LRB): RIGHT TOTAL HIP ARTHROPLASTY ANTERIOR APPROACH (Right) Up with therapy  BLACKMAN,CHRISTOPHER Y 07/11/2015, 7:41 AM

## 2015-07-11 NOTE — Evaluation (Signed)
Physical Therapy Evaluation Patient Details Name: Scott Rowland MRN: [id number] DOB: [birth year] Today's Date: 07/11/2015   History of Present Illness  Admitted for R THA; Direct anterior; WBAT;  has a past medical history of Cancer ([place]) (2014); History of kidney stones; Hypertension; Arthritis; and Sciatica.  Clinical Impression   Pt is s/p THA resulting in the deficits listed below (see PT Problem List).  Pt will benefit from skilled PT to increase their independence and safety with mobility to allow discharge to the venue listed below.      Follow Up Recommendations Home health PT;Supervision - Intermittent    Equipment Recommendations  Rolling walker with 5" wheels;3in1 (PT)    Recommendations for Other Services OT consult     Precautions / Restrictions Precautions Precautions: None Restrictions RLE Weight Bearing: Weight bearing as tolerated      Mobility  Bed Mobility Overal bed mobility: Needs Assistance Bed Mobility: Supine to Sit     Supine to sit: Supervision     General bed mobility comments: Cues for technique  Transfers Overall transfer level: Needs assistance Equipment used: Rolling walker (2 wheeled) Transfers: Sit to/from Stand Sit to Stand: Min guard         General transfer comment: Cues for safety, hand placement, and technique  Ambulation/Gait Ambulation/Gait assistance: Min guard;Supervision Ambulation Distance (Feet): 120 Feet Assistive device: Rolling walker (2 wheeled) Gait Pattern/deviations: Step-through pattern;Decreased step length - left;Decreased stance time - right Gait velocity: slowed   General Gait Details: Cues initially for gait sequence and RW use; Overall managing quite well; cues to self-monitor for activity tolerance as well as to activate gluteals for more stance stability  Stairs            Wheelchair Mobility    Modified Rankin (Stroke Patients Only)       Balance Overall balance assessment: Needs  assistance           Standing balance-Leahy Scale: Fair                               Pertinent Vitals/Pain Pain Assessment: 0-10 Pain Score: 2  Pain Location: R hip Pain Descriptors / Indicators: Aching;Sore Pain Intervention(s): Monitored during session    Home Living Family/patient expects to be discharged to:: Private residence Living Arrangements: Spouse/significant other Available Help at Discharge: Family;Available 24 hours/day Type of Home: House Home Access: Stairs to enter Entrance Stairs-Rails: None Entrance Stairs-Number of Steps: 2 Home Layout: Two level;Bed/bath upstairs Home Equipment: None      Prior Function Level of Independence: Independent               Hand Dominance        Extremity/Trunk Assessment   Upper Extremity Assessment: Overall WFL for tasks assessed           Lower Extremity Assessment: RLE deficits/detail RLE Deficits / Details: Grossly decr AROM and strength R hip, limited by soreness postop       Communication   Communication: No difficulties  Cognition Arousal/Alertness: Awake/alert Behavior During Therapy: WFL for tasks assessed/performed Overall Cognitive Status: Within Functional Limits for tasks assessed                      General Comments General comments (skin integrity, edema, etc.): Discussed anticipated progression, HEP; he will be going to his brother-in-law's place in [place] for initial recuperation (his Brother-in-law is a HHPT)    Exercises Total  Joint Exercises Quad Sets: AROM;Right;10 reps Gluteal Sets: AROM;Both;10 reps Towel Squeeze: AROM;Both;10 reps Heel Slides: AROM;Left;5 reps Hip ABduction/ADduction: AROM;Right;10 reps      Assessment/Plan    PT Assessment Patient needs continued PT services  PT Diagnosis Difficulty walking;Acute pain   PT Problem List Decreased strength;Decreased range of motion;Decreased activity tolerance;Decreased mobility;Decreased  knowledge of use of DME;Decreased knowledge of precautions;Pain  PT Treatment Interventions DME instruction;Gait training;Stair training;Functional mobility training;Therapeutic activities;Therapeutic exercise;Patient/family education   PT Goals (Current goals can be found in the Care Plan section) Acute Rehab PT Goals Patient Stated Goal: Be able to get back to physical activity PT Goal Formulation: With patient Time For Goal Achievement: 07/18/15 Potential to Achieve Goals: Good    Frequency 7X/week   Barriers to discharge Inaccessible home environment Flight of steps to access bedroom/bathroom    Co-evaluation               End of Session Equipment Utilized During Treatment: Gait belt Activity Tolerance: Patient tolerated treatment well Patient left: in chair;with call bell/phone within reach;with family/visitor present Nurse Communication: Mobility status         Time: [id number] PT Time Calculation (min) (ACUTE ONLY): 33 min   Charges:   PT Evaluation $PT Eval Low Complexity: 1 Procedure PT Treatments $Gait Training: 8-22 mins   PT G Codes:        Quin Hoop 07/11/2015, 10:53 AM  Roney Marion, [place] Pager [phone] Office [phone]

## 2015-07-11 NOTE — Clinical Social Work Note (Signed)
CSW received referral for SNF.  Case discussed with case manager, and plan is to discharge home with home health.  CSW to sign off please re-consult if social work needs arise.  Safiatou Islam R. Cashae Weich, MSW, LCSWA [phone]  

## 2015-07-12 MED ORDER — METHOCARBAMOL 500 MG PO TABS: 500.0000 mg | ORAL_TABLET | Freq: Four times a day (QID) | ORAL | Status: AC | PRN

## 2015-07-12 MED ORDER — ASPIRIN 325 MG PO TBEC: 325.0000 mg | DELAYED_RELEASE_TABLET | Freq: Two times a day (BID) | ORAL | Status: AC

## 2015-07-12 MED ORDER — OXYCODONE-ACETAMINOPHEN 5-325 MG PO TABS: 1.0000 | ORAL_TABLET | ORAL | Status: AC | PRN

## 2015-07-12 NOTE — Progress Notes (Signed)
Subjective: 2 Days Post-Op Procedure(s) (LRB): RIGHT TOTAL HIP ARTHROPLASTY ANTERIOR APPROACH (Right) Patient reports pain as moderate.  No complaints.   Objective: Vital signs in last 24 hours: Temp:  [98.4 F (36.9 C)-100 F (37.8 C)] 99.5 F (37.5 C) (01/12 0514) Pulse Rate:  [80-93] 80 (01/12 0514) Resp:  [16-18] 16 (01/12 0514) BP: (111-116)/(66-69) 111/66 mmHg (01/12 0514) SpO2:  [95 %-99 %] 95 % (01/12 0514)  Intake/Output from previous day: 01/11 0701 - 01/12 0700 In: 770 [P.O.:770] Out: 1300 [Urine:1300] Intake/Output this shift:     Recent Labs  07/11/15 0720  HGB 12.2*    Recent Labs  07/11/15 0720  WBC 8.0  RBC 4.21*  HCT 38.8*  PLT 253    Recent Labs  07/11/15 0720  NA 139  K 4.2  CL 105  CO2 28  BUN 10  CREATININE 1.08  GLUCOSE 112*  CALCIUM 8.6*   No results for input(s): LABPT, INR in the last 72 hours.  Sensation intact distally Intact pulses distally Dorsiflexion/Plantar flexion intact Incision: no drainage Compartment soft  Assessment/Plan: 2 Days Post-Op Procedure(s) (LRB): RIGHT TOTAL HIP ARTHROPLASTY ANTERIOR APPROACH (Right) Discharge home today Follow up with Dr.blackman at 2 weeks post-op  Erskine Emery 07/12/2015, 8:58 AM

## 2015-07-12 NOTE — Discharge Instructions (Signed)

## 2015-07-12 NOTE — Progress Notes (Signed)
Physical Therapy Treatment Patient Details Name: Scott Rowland MRN: [id number] DOB: 10-16-1940 Today's Date: 07/12/2015    History of Present Illness Admitted for R THA; Direct anterior; WBAT;  has a past medical history of Cancer ([place]) (2014); History of kidney stones; Hypertension; Arthritis; and Sciatica.    PT Comments    Patient is making good progress with PT.  From a mobility standpoint anticipate patient will be ready for DC home when medically ready.     Follow Up Recommendations  Home health PT;Supervision - Intermittent     Equipment Recommendations  Rolling walker with 5" wheels;3in1 (PT)    Recommendations for Other Services OT consult     Precautions / Restrictions Precautions Precautions: None Restrictions Weight Bearing Restrictions: Yes RLE Weight Bearing: Weight bearing as tolerated    Mobility  Bed Mobility               General bed mobility comments: OOB in chair upon arrival  Transfers Overall transfer level: Needs assistance Equipment used: Rolling walker (2 wheeled) Transfers: Sit to/from Stand Sit to Stand: Supervision         General transfer comment: carry over of safe hand placement and technique; supervision for safety  Ambulation/Gait Ambulation/Gait assistance: Supervision;Min guard Ambulation Distance (Feet): 350 Feet Assistive device: Rolling walker (2 wheeled);Straight cane Gait Pattern/deviations: Step-through pattern;Decreased stride length   Gait velocity interpretation: Below normal speed for age/gender General Gait Details: cues for 3 point gait pattern with straight cane for 150 ft; pt with no LOB and with steady equal bilat step lengths; pt remains slightly guarded with decreased stride length; vc for upright posture intially with RW with ability to correct rest of session; vc for increased bilat heel strike with abiliy to correct with cues   Stairs Stairs: Yes Stairs assistance: Min guard Stair Management: One  rail Right;Sideways Number of Stairs: 2 (X 2) General stair comments: wife present for session; pt with two handrails at home but can only use one at a time; pt with no unsteadiness and with carry over of seqencing   Wheelchair Mobility    Modified Rankin (Stroke Patients Only)       Balance Overall balance assessment: Needs assistance Sitting-balance support: No upper extremity supported Sitting balance-Leahy Scale: Good     Standing balance support: Single extremity supported Standing balance-Leahy Scale: Fair                      Cognition Arousal/Alertness: Awake/alert Behavior During Therapy: WFL for tasks assessed/performed Overall Cognitive Status: Within Functional Limits for tasks assessed                      Exercises Total Joint Exercises Hip ABduction/ADduction: AROM;Right;10 reps;Standing Knee Flexion: AROM;Right;10 reps;Standing Marching in Standing: AROM;Right;10 reps;Standing    General Comments        Pertinent Vitals/Pain Pain Assessment: 0-10 Pain Score: 2  Pain Location: R hip Pain Descriptors / Indicators: Discomfort;Sore Pain Intervention(s): Limited activity within patient's tolerance;Monitored during session;Premedicated before session    Home Living                      Prior Function            PT Goals (current goals can now be found in the care plan section) Acute Rehab PT Goals Patient Stated Goal: Be able to get back to physical activity PT Goal Formulation: With patient Time For Goal Achievement: 07/18/15 Potential to  Achieve Goals: Good Progress towards PT goals: Progressing toward goals    Frequency  7X/week    PT Plan Current plan remains appropriate    Co-evaluation             End of Session Equipment Utilized During Treatment: Gait belt Activity Tolerance: Patient tolerated treatment well Patient left: with call bell/phone within reach;with family/visitor present (OT in with pt to  begin session)     Time: [id number] PT Time Calculation (min) (ACUTE ONLY): 33 min  Charges:  $Gait Training: 8-22 mins $Therapeutic Exercise: 8-22 mins                    G Codes:      [place] April, PTA Pager: [phone]   07/12/2015, 12:07 PM

## 2015-07-12 NOTE — Progress Notes (Signed)
Occupational Therapy Treatment/Discharge Patient Details Name: Scott Rowland MRN: [id number] DOB: 11-Jun-1941 Today's Date: 07/12/2015    History of present illness Admitted for R THA; Direct anterior; WBAT;  has a past medical history of Cancer ([place]) (2014); History of kidney stones; Hypertension; Arthritis; and Sciatica.   OT comments  Pt progressing well and is adequate for discharge from occupational therapy standpoint. Pt completed shower and toilet transfers using [facility] with supervision. Educated pt and wife on fall prevention and pain management strategies. All education has been completed and pt has no further questions. OT signing off.   Follow Up Recommendations  No OT follow up;Supervision - Intermittent    Equipment Recommendations  3 in 1 bedside comode    Recommendations for Other Services      Precautions / Restrictions Precautions Precautions: None Restrictions Weight Bearing Restrictions: Yes RLE Weight Bearing: Weight bearing as tolerated       Mobility Bed Mobility               General bed mobility comments: Pt up with PT on OT arrival  Transfers Overall transfer level: Needs assistance Equipment used: Straight cane Transfers: Sit to/from Stand Sit to Stand: Supervision         General transfer comment: Supervision for safety, no physical assist. Good hand placement and technique.    Balance Overall balance assessment: Needs assistance Sitting-balance support: No upper extremity supported;Feet supported Sitting balance-Leahy Scale: Good     Standing balance support: Single extremity supported;During functional activity Standing balance-Leahy Scale: Fair                     ADL Overall ADL's : Needs assistance/impaired                         Toilet Transfer: Supervision/safety;Ambulation;BSC [facility]) Toilet Transfer Details (indicate cue type and reason): BSC over toilet Toileting- Clothing Manipulation and Hygiene:  Supervision/safety;Sit to/from stand   Tub/ Shower Transfer: Walk-in shower;Supervision/safety;Ambulation [facility]) Tub/Shower Transfer Details (indicate cue type and reason): Pt will now be using walk-in shower. Educated using RW and SPC. Functional mobility during ADLs: Supervision/safety;Rolling walker General ADL Comments: Pt's wife will assist with LB ADLs and will supervise pt getting in/out shower. Educated on fall prevention and pain management strategies      Vision                     Perception     Praxis      Cognition   Behavior During Therapy: WFL for tasks assessed/performed Overall Cognitive Status: Within Functional Limits for tasks assessed                       Extremity/Trunk Assessment               Exercises     Shoulder Instructions       General Comments      Pertinent Vitals/ Pain       Pain Assessment: 0-10 Pain Score: 2  Pain Location: R hip Pain Descriptors / Indicators: Aching Pain Intervention(s): Limited activity within patient's tolerance;Monitored during session;Repositioned;Ice applied  Home Living                                          Prior Functioning/Environment  Frequency       Progress Toward Goals  OT Goals(current goals can now be found in the care plan section)  Progress towards OT goals: Goals met/education completed, patient discharged from OT  Acute Rehab OT Goals Patient Stated Goal: Be able to get back to physical activity OT Goal Formulation: With patient Time For Goal Achievement: 07/25/15 Potential to Achieve Goals: Good ADL Goals Pt Will Perform Tub/Shower Transfer: Tub transfer;with supervision;ambulating;3 in 1;rolling walker  Plan All goals met and education completed, patient discharged from OT services    Co-evaluation                 End of Session Equipment Utilized During Treatment: Gait belt;Other (comment) [facility])   Activity  Tolerance Patient tolerated treatment well   Patient Left in chair;with call bell/phone within reach;with family/visitor present;Other (comment) (ice applied)   Nurse Communication Mobility status;Other (comment) (Pt/wife with pain medication questions)        Time: 1026-1040 OT Time Calculation (min): 14 min  Charges: OT General Charges $OT Visit: 1 Procedure OT Treatments $Self Care/Home Management : 8-22 mins  Redmond Baseman, OTR/L Pager: [phone] 07/12/2015, 11:27 AM

## 2015-07-12 NOTE — Discharge Summary (Signed)
Patient ID: Scott Rowland MRN: [id number] DOB/AGE: Jul 27, 1940 75 y.o.  Admit date: 07/10/2015 Discharge date: 07/12/2015  Admission Diagnoses:  Principal Problem:   Osteoarthritis of right hip Active Problems:   Status post total replacement of right hip   Discharge Diagnoses:  Same  Past Medical History  Diagnosis Date  . 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 History of kidney stones   . Hypertension   . Arthritis     osteoarthritis  . Sciatica     left leg    Surgeries: Procedure(s): RIGHT TOTAL HIP ARTHROPLASTY ANTERIOR APPROACH on 07/10/2015   Consultants:  PT/OT  Discharged Condition: Improved  Hospital Course: Scott Rowland is an 75 y.o. male who was admitted 07/10/2015 for operative treatment ofOsteoarthritis of right hip. Patient has severe unremitting pain that affects sleep, daily activities, and work/hobbies. After pre-op clearance the patient was taken to the operating room on 07/10/2015 and underwent  Procedure(s): RIGHT TOTAL HIP ARTHROPLASTY ANTERIOR APPROACH.    Patient was given perioperative antibiotics: Anti-infectives    Start     Dose/Rate Route Frequency Ordered Stop   07/10/15 2000  ceFAZolin (ANCEF) IVPB 1 g/50 mL premix     1 g 100 mL/hr over 30 Minutes Intravenous Every 6 hours 07/10/15 1911 07/11/15 0236   07/10/15 1430  ceFAZolin (ANCEF) IVPB 2 g/50 mL premix     2 g 100 mL/hr over 30 Minutes Intravenous To [facility] 07/09/15 1303 07/10/15 1435       Patient was given sequential compression devices, early ambulation, and chemoprophylaxis to prevent DVT.  Patient benefited maximally from hospital stay and there were no complications.    Recent vital signs: Patient Vitals for the past 24 hrs:  BP Temp Temp src Pulse Resp SpO2  07/12/15 0514 111/66 mmHg 99.5 F (37.5 C) - 80 16 95 %  07/11/15 2007 116/69 mmHg 100 F (37.8 C) Oral 93 17 99 %  07/11/15 1317 116/66 mmHg 98.4 F (36.9 C) Oral 83 18 97 %     Recent laboratory  studies:  Recent Labs  07/11/15 0720  WBC 8.0  HGB 12.2*  HCT 38.8*  PLT 253  NA 139  K 4.2  CL 105  CO2 28  BUN 10  CREATININE 1.08  GLUCOSE 112*  CALCIUM 8.6*     Discharge Medications:     Medication List    STOP taking these medications        ibuprofen 200 MG tablet  Commonly known as:  ADVIL,MOTRIN      TAKE these medications        aspirin 325 MG EC tablet  Take 1 tablet (325 mg total) by mouth 2 (two) times daily after a meal.     lisinopril 5 MG tablet  Commonly known as:  PRINIVIL,ZESTRIL  Take 5 mg by mouth daily.     methocarbamol 500 MG tablet  Commonly known as:  ROBAXIN  Take 1 tablet (500 mg total) by mouth every 6 (six) hours as needed for muscle spasms.     multivitamin with minerals Tabs tablet  Take 1 tablet by mouth daily.     oxyCODONE-acetaminophen 5-325 MG tablet  Commonly known as:  ROXICET  Take 1-2 tablets by mouth every 4 (four) hours as needed for severe pain.     rosuvastatin 5 MG tablet  Commonly known as:  CRESTOR  Take 5 mg by mouth every other day.        Diagnostic Studies: Dg Hip  Port Unilat With Pelvis 1v Right  07/10/2015  CLINICAL DATA:  Post- operative right anterior approach hip joint replacement. EXAM: DG HIP (WITH OR WITHOUT PELVIS) 1V PORT RIGHT COMPARISON:  Intraoperative images of today's date FINDINGS: The patient has undergone right total hip joint prosthesis placement. Radiographic positioning of the prosthetic components is good. The interface with the native bone is normal. The surrounding soft tissues exhibit small amounts of gas. IMPRESSION: There is no immediate postprocedure complication following right total hip joint prosthesis placement. Electronically Signed   By: David  Martinique M.D.   On: 07/10/2015 16:45   Dg Hip Operative Unilat With Pelvis Right  07/10/2015  CLINICAL DATA:  Right hip arthroplasty. EXAM: OPERATIVE RIGHT HIP (WITH PELVIS IF PERFORMED) 2 VIEWS TECHNIQUE: Fluoroscopic spot image(s)  were submitted for interpretation post-operatively. COMPARISON:  None. FINDINGS: Alignment of a right hip arthroplasty appears anatomic in the frontal projection. No fracture or abnormal lucency identified. IMPRESSION: Anatomic alignment of right hip arthroplasty. Electronically Signed   By: Aletta Edouard M.D.   On: 07/10/2015 15:51    Disposition: Home       Follow-up Information    Follow up with Mcarthur Rossetti, MD. Schedule an appointment as soon as possible for a visit in 2 weeks.   Specialty:  Orthopedic Surgery   Contact information:   [place] [address] [phone]        Signed: Erskine Emery 07/12/2015, 8:57 AM

## 2016-02-16 ENCOUNTER — Encounter (INDEPENDENT_AMBULATORY_CARE_PROVIDER_SITE_OTHER): Payer: Self-pay

## 2016-04-30 ENCOUNTER — Ambulatory Visit (INDEPENDENT_AMBULATORY_CARE_PROVIDER_SITE_OTHER): Payer: Medicare Other | Admitting: Physician Assistant

## 2016-04-30 ENCOUNTER — Ambulatory Visit (INDEPENDENT_AMBULATORY_CARE_PROVIDER_SITE_OTHER): Payer: Medicare Other

## 2016-04-30 DIAGNOSIS — M1611 Unilateral primary osteoarthritis, right hip: Secondary | ICD-10-CM | POA: Diagnosis not present

## 2016-04-30 DIAGNOSIS — Z96641 Presence of right artificial hip joint: Secondary | ICD-10-CM

## 2016-04-30 NOTE — Progress Notes (Signed)
Office Visit Note   Patient: Scott Rowland           Date of Birth: 04-15-1941           MRN: [id number] Visit Date: 04/30/2016              Requested by: No referring provider defined for this encounter. PCP: Pcp Not In System   Assessment & Plan: Visit Diagnoses:  1. Primary osteoarthritis of right hip   2. Status post hip replacement, right     Plan: Plan is to follow up as needed.Antibiotics for any major dental procedures will be needed.   Follow-Up Instructions: Return if symptoms worsen or fail to improve.   Orders:  Orders Placed This Encounter  Procedures  . XR HIP UNILAT W OR W/O PELVIS 1V RIGHT   No orders of the defined types were placed in this encounter.     Procedures: No procedures performed   Clinical Data: No additional findings.   Subjective: Chief Complaint  Patient presents with  . Right Hip - Routine Post Op    Patient here today followup from total hip replacement 07/10/15. Patient states he is doing very well, with no problems. He has great range of motion and strength in the hip. He also states the swelling/seroma he was having before is no longer an issue. He has questions about possible infections    Review of Systems: Please see history of present illness otherwise negative   Objective: Vital Signs: There were no vitals taken for this visit.  Physical Exam  Constitutional: He is oriented to person, place, and time. He appears well-developed and well-nourished.  Neurological: He is alert and oriented to person, place, and time.  Skin: Skin is warm and dry.  Surgical incision benign     Right Hip Exam   Tenderness  The patient is experiencing no tenderness.     Range of Motion  Internal Rotation: normal  External Rotation: normal      Right hip good range of motion without pain. Ambulates without any assistive device. Right calf supple non tender.  Specialty Comments:  No specialty comments  available.  Imaging: Xr Hip Unilat W Or W/o Pelvis 1v Right  Result Date: 04/30/2016 Radiographs AP pelvis and lateral view of the right hip shows no acute fracture. Well-seated right total hip arthroplasty components without complication.    PMFS History: Patient Active Problem List   Diagnosis Date Noted  . Osteoarthritis of right hip 07/10/2015  . Status post total replacement of right hip 07/10/2015   Past Medical History:  Diagnosis Date  . Arthritis    osteoarthritis  . Cancer [facility]) 2014   Prostate  . History of kidney stones   . Hypertension   . Sciatica    left leg    No family history on file.  Past Surgical History:  Procedure Laterality Date  . APPENDECTOMY    . PROSTATE SURGERY    . TONSILLECTOMY    . TOTAL HIP ARTHROPLASTY Right 07/10/2015   Procedure: RIGHT TOTAL HIP ARTHROPLASTY ANTERIOR APPROACH;  Surgeon: Mcarthur Rossetti, MD;  Location: [facility];  Service: Orthopedics;  Laterality: Right;   Social History   Occupational History  . Not on file.   Social History Main Topics  . Smoking status: Never Smoker  . Smokeless tobacco: Never Used  . Alcohol use 1.8 oz/week    2 Glasses of wine, 1 Cans of beer per week  . Drug use: No  .  Sexual activity: Not on file

## 2017-04-01 IMAGING — RF DG HIP (WITH PELVIS) OPERATIVE*R*
1 series · 2 of 2 positions shown · non-contrast
Comparison: None.

CLINICAL DATA: Right hip arthroplasty.

EXAM:
OPERATIVE RIGHT HIP (WITH PELVIS IF PERFORMED) 2 VIEWS
TECHNIQUE: Fluoroscopic spot image(s) were submitted for interpretation
post-operatively.

[Series 1: run · 2 of 2 slices shown]
[im 1/2]
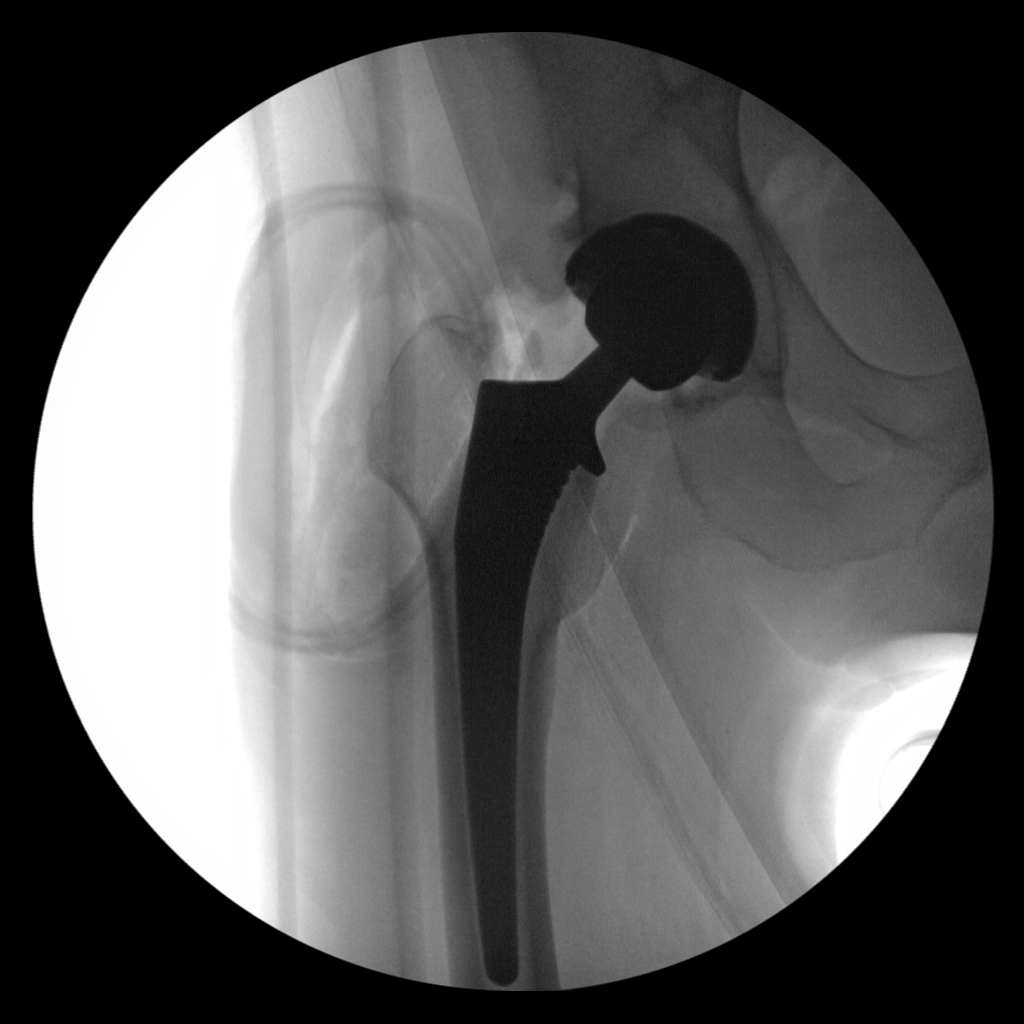
[im 2/2]
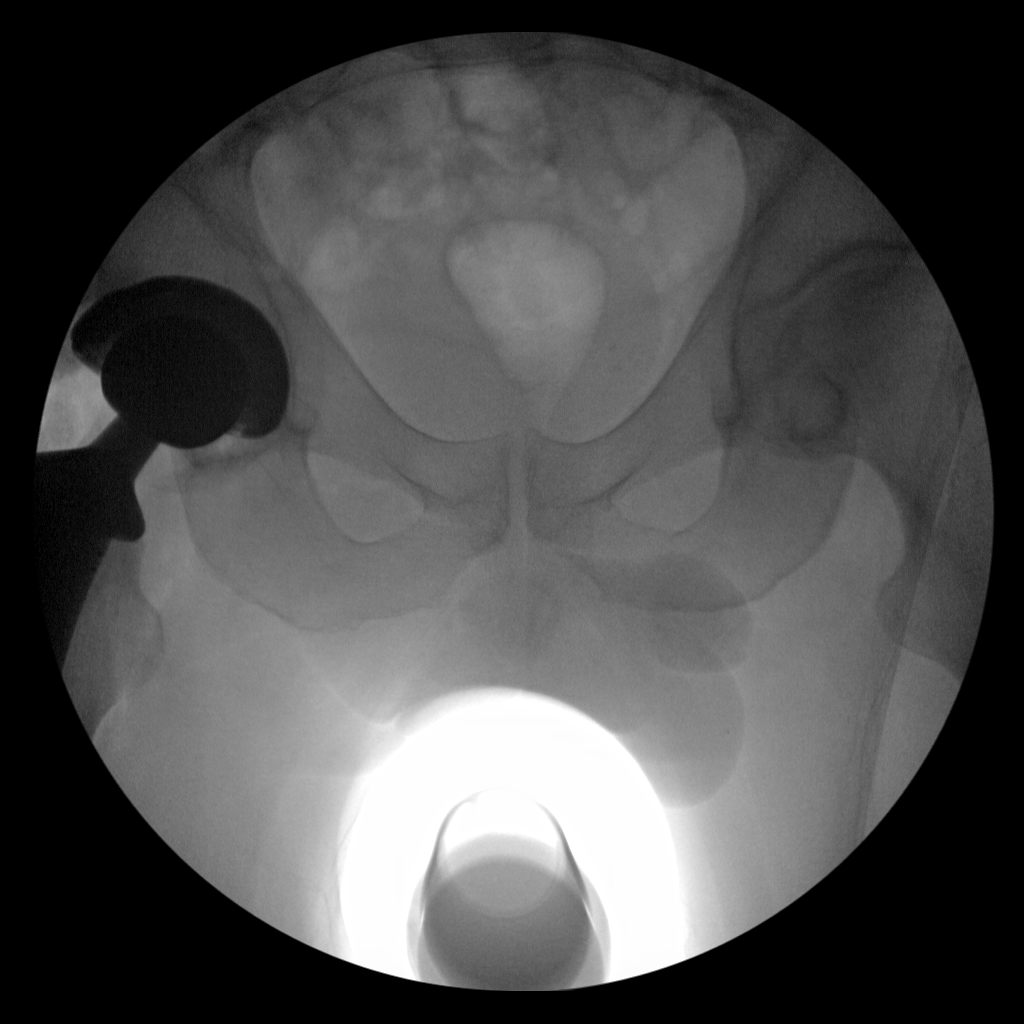

[2 of 2 positions shown; findings below may reference images not displayed]

FINDINGS: Alignment of a right hip arthroplasty appears anatomic in the
frontal projection. No fracture or abnormal lucency identified.
IMPRESSION: Anatomic alignment of right hip arthroplasty.
